# Patient Record
Sex: Male | Born: 1960 | Race: White | Hispanic: No | Marital: Single | Smoking: Current every day smoker
Health system: Southern US, Community
[De-identification: ages and names within clinical notes are randomized; demographics above are authoritative.]

---

## 2007-09-07 ENCOUNTER — Observation Stay (HOSPITAL_COMMUNITY): Admission: RE | Admit: 2007-09-07 | Discharge: 2007-09-08 | Payer: Self-pay | Admitting: Neurosurgery

## 2007-09-29 ENCOUNTER — Encounter: Admission: RE | Admit: 2007-09-29 | Discharge: 2007-09-29 | Payer: Self-pay | Admitting: Neurosurgery

## 2007-10-13 ENCOUNTER — Encounter: Admission: RE | Admit: 2007-10-13 | Discharge: 2007-10-13 | Payer: Self-pay | Admitting: Neurosurgery

## 2007-12-31 ENCOUNTER — Encounter: Admission: RE | Admit: 2007-12-31 | Discharge: 2007-12-31 | Payer: Self-pay | Admitting: Neurosurgery

## 2011-05-07 NOTE — Op Note (Signed)
NAMETIERRE, NETTO NO.:  [id number]   MEDICAL RECORD NO.:  [id number]          PATIENT TYPE:  INP   LOCATION:  5148                         FACILITY:  [facility]   PHYSICIAN:  Donalee Citrin, M.D.        DATE OF BIRTH:  May 18, 1961   DATE OF PROCEDURE:  DATE OF DISCHARGE:                               OPERATIVE REPORT   PREOPERATIVE DIAGNOSIS:  Cervical spondylosis with stenosis C4-5, C5-6  and C6-7.   PROCEDURE:  Anterior cervical diskectomy and fusion C4-5, C5-6 and C6-7  using 7 mm allograft wedges and a 60 mm Atlantis translational plate.   SURGEON:  Donalee Citrin, M.D.   ASSISTANT:  Kathaleen Maser. Pool, M.D.   ANESTHESIA:  General endotracheal.   HISTORY OF PRESENT ILLNESS:  The patient is a very pleasant 50 year old  gentleman with longstanding neck and bilateral arm pain, worse on the  left, in his left shoulder and down to his left hand, __________  distribution.  The patient has failed all forms of conservative  treatment and MRI scan showed kyphosis with spondylosis and cervical  stenosis, causing spinal cord compression and biforaminal stenosis at C4-  5, C5-6 and C6-7.  Patient failed all forms of conservative treatment  and was recommended to undergo cervical diskectomy and fusion.  The  risks and benefits of the operation were explained to the patient, who  understood and agreed to proceed forward.   PROCEDURE:  Patient was brought to the OR.  He was induced under general  endotracheal.  He was positioned supine, with the next flexed in  extension and 5 pounds of halter traction was applied.  The left side of  his neck was prepped and draped in the usual sterile fashion.  Preoperative x-ray localized the appropriate level at the C5-6 disk  space.  A curvilinear incision was made just off the midline to the  anterior portion of the sternocleidomastoid muscle .  The superficial  layer of the platysma was dissected out and divided longitudinally.  The  avascular  plane of the sternocleidomastoid muscle was developed down to  the prevertebral fascia.  The prevertebral fascia was then dissected  with the Kittner's.  Intraoperative x-ray confirmed  localization at the  C4-5 disk space and __________ disc space and then the longus colli was  reflected laterally at C4-5, C5-6 and C6-7.  Annulotomies were made at  all 3 disk spaces and self-retaining retractor was placed and then large  anterior osteophytes were underbitten off the C4, C5 and C6 vertebral  bodies bilaterally.  Upgoing curettes were used to scrape the disk  spaces and a high speed drill was used on all 3 interspaces down  posterior annulus and posterior __________  complexes.  Then the  operating microscope was draped, brought into the field and under  microscopic illumination,  first at C5-6 a __________  was drilled drown  to the posterior annulus and posterior longitudinal ligament.  Then  using a 1 mm Kerrison punch, a large osteophyte was underbitten  at the  C6 and C5 vertebral bodies, exposing the PLL  which was removed in  piecemeal fashion __________  thecal sac.  There is a marked spinal cord  compression at thecal sac compressing throughout the central canal as  well as biforaminally.  This was all underbitten  and the thecal sac was  decompressed.  Both proximal C6 pedicles were identified and C6  neuroforaminal were opened up.  Due to a significant uncinate  hypertrophy as well as spondylosis, the stenosis was freed up and the  disk space was decompressed at this level.  Gelfoam was placed.  Attention was directed at C6-7.  In a similar fashion,  C6-7 was  decompressed.  A very large uncinate hypertrophy with large osteophyte  was compressed in the right C7 nerve but this was all decompressed and  underbitten.  The C7 nerve root was skeletonized out the right side,  flush with the pedicle.  This procedure was repeated on the left side.  Again, once the end plates had been  underbitten aggressively,  decompression of the central canal on both C7 neuroforamen.  Gelfoam was  placed and attention was taken at C4-5.  In a similar fashion, a  large  osteophyte  at C4 vertebral body was underbitten with a 2 and 3 mm  Kerrison punch, decompressed in the central canal and both C5 pedicles  were identified.  Both C5 neuroforamen were identified.  Care was taken  not to aggressive __________ at the C5 nerve roots at each level but the  central canal was widely decompressed by aggressive underbiting of both  end plates.  Then the end plates  were scraped.  A size 7 medium  allograft was inserted at C4-5, C5-6, and C6-7 1mm deep to the  intervertebral line.  A 60 mm translation Atlantis plate was then  inserted with fixed angle screw placed at C4, C5, C6 and C7.  All screws  had excellent purchase.  Postop fluoroscopy confirmed visualization of  plates, screws, and bone grafts.  Then the wound was copiously irrigated  and meticulous hemostasis was obtained, Surgifoam was placed in the disk  spaces and irrigated out.  Then the JP drain was then placed and the  wound was closed in layers with Vicryl in the platysma, running 4-0  subcuticular.  Benzoin and Steri-Strips were applied. The patient was  taken to the recovery in stable condition.  At the end of the case,  needle and sponge counts were correct.           ______________________________  Donalee Citrin, M.D.     GC/MEDQ  D:  09/07/2007  T:  09/08/2007  Job:  [id number]

## 2011-10-04 LAB — DIFFERENTIAL
Basophils Absolute: 0
Basophils Relative: 0
Eosinophils Relative: 1
Lymphs Abs: 0.9
Monocytes Relative: 7

## 2011-10-04 LAB — CBC
HCT: 41.1
Hemoglobin: 14
MCHC: 34
RBC: 4.22
RDW: 12.6

## 2015-07-24 ENCOUNTER — Encounter (HOSPITAL_COMMUNITY): Payer: Self-pay | Admitting: Cardiology

## 2015-07-24 ENCOUNTER — Emergency Department (HOSPITAL_COMMUNITY): Payer: Medicare Other

## 2015-07-24 ENCOUNTER — Emergency Department (HOSPITAL_COMMUNITY)
Admission: EM | Admit: 2015-07-24 | Discharge: 2015-07-24 | Disposition: A | Discharge status: Home / Self Care | Payer: Medicare Other | Attending: Emergency Medicine | Admitting: Emergency Medicine

## 2015-07-24 DIAGNOSIS — G8929 Other chronic pain: Secondary | ICD-10-CM | POA: Insufficient documentation

## 2015-07-24 DIAGNOSIS — M542 Cervicalgia: Secondary | ICD-10-CM | POA: Diagnosis not present

## 2015-07-24 DIAGNOSIS — Z720 Tobacco use: Secondary | ICD-10-CM | POA: Diagnosis not present

## 2015-07-24 DIAGNOSIS — R569 Unspecified convulsions: Secondary | ICD-10-CM | POA: Diagnosis present

## 2015-07-24 LAB — COMPREHENSIVE METABOLIC PANEL
ALBUMIN: 3.9 g/dL (ref 3.5–5.0)
ALT: 41 U/L (ref 17–63)
ANION GAP: 14 (ref 5–15)
AST: 63 U/L — ABNORMAL HIGH (ref 15–41)
Alkaline Phosphatase: 45 U/L (ref 38–126)
BUN: 6 mg/dL (ref 6–20)
CO2: 21 mmol/L — ABNORMAL LOW (ref 22–32)
CREATININE: 0.95 mg/dL (ref 0.61–1.24)
Calcium: 8.8 mg/dL — ABNORMAL LOW (ref 8.9–10.3)
Chloride: 97 mmol/L — ABNORMAL LOW (ref 101–111)
Glucose, Bld: 118 mg/dL — ABNORMAL HIGH (ref 65–99)
POTASSIUM: 3.9 mmol/L (ref 3.5–5.1)
SODIUM: 132 mmol/L — AB (ref 135–145)
Total Bilirubin: 1.4 mg/dL — ABNORMAL HIGH (ref 0.3–1.2)
Total Protein: 6.7 g/dL (ref 6.5–8.1)

## 2015-07-24 LAB — CBC WITH DIFFERENTIAL/PLATELET
BASOS PCT: 0 % (ref 0–1)
Basophils Absolute: 0 10*3/uL (ref 0.0–0.1)
EOS ABS: 0 10*3/uL (ref 0.0–0.7)
Eosinophils Relative: 0 % (ref 0–5)
HEMATOCRIT: 34.5 % — AB (ref 39.0–52.0)
Hemoglobin: 12.2 g/dL — ABNORMAL LOW (ref 13.0–17.0)
LYMPHS ABS: 0.4 10*3/uL — AB (ref 0.7–4.0)
LYMPHS PCT: 8 % — AB (ref 12–46)
MCH: 33.9 pg (ref 26.0–34.0)
MCHC: 35.4 g/dL (ref 30.0–36.0)
MCV: 95.8 fL (ref 78.0–100.0)
MONO ABS: 0.4 10*3/uL (ref 0.1–1.0)
MONOS PCT: 8 % (ref 3–12)
NEUTROS ABS: 3.8 10*3/uL (ref 1.7–7.7)
NEUTROS PCT: 84 % — AB (ref 43–77)
Platelets: 100 10*3/uL — ABNORMAL LOW (ref 150–400)
RBC: 3.6 MIL/uL — ABNORMAL LOW (ref 4.22–5.81)
RDW: 14 % (ref 11.5–15.5)
WBC: 4.6 10*3/uL (ref 4.0–10.5)

## 2015-07-24 LAB — CBG MONITORING, ED: Glucose-Capillary: 125 mg/dL — ABNORMAL HIGH (ref 65–99)

## 2015-07-24 NOTE — ED Provider Notes (Signed)
CSN: [id number]     Arrival date & time 07/24/15  1201 History   First MD Initiated Contact with Patient 07/24/15 1202     Chief Complaint  Patient presents with  . Seizures     (Consider location/radiation/quality/duration/timing/severity/associated sxs/prior Treatment) Patient is a 54 y.o. male presenting with seizures. The history is provided by the patient and the EMS personnel. No language interpreter was used.  Seizures Seizure activity on arrival: no   Seizure type:  Unable to specify (EMS relates possible Grand-mal like activity) Preceding symptoms: no dizziness, no headache, no nausea and no numbness   Initial focality:  Unable to specify Episode characteristics: abnormal movements and generalized shaking   Return to baseline: yes   Severity:  Unable to specify Number of seizures this episode:  1 Progression:  Unable to specify Context: stress (pt states has been under increased stress lately)   Context: not alcohol withdrawal, not change in medication (pt was in pain clinic at time of seizure, denies any change in meds there), not drug use (pt denies), not fever, not intracranial lesion and not previous head injury   Recent head injury:  No recent head injuries PTA treatment:  None History of seizures: no     History reviewed. No pertinent past medical history. History reviewed. No pertinent past surgical history. History reviewed. No pertinent family history. History  Substance Use Topics  . Smoking status: Current Every Day Smoker  . Smokeless tobacco: Not on file  . Alcohol Use: Not on file    Review of Systems  Constitutional: Negative for fever and chills.  HENT: Negative for congestion and rhinorrhea.   Eyes: Negative for photophobia and visual disturbance.  Respiratory: Negative for shortness of breath and wheezing.   Cardiovascular: Negative for chest pain.  Gastrointestinal: Negative for nausea, vomiting and abdominal pain.  Genitourinary: Negative for  dysuria and difficulty urinating.  Musculoskeletal: Positive for neck pain (chronic, not recently worsened). Negative for gait problem.  Skin: Negative for color change and rash.  Neurological: Positive for seizures (as described in HPI). Negative for dizziness, light-headedness and headaches.  Psychiatric/Behavioral: Negative for confusion and agitation.      Allergies  Review of patient's allergies indicates no known allergies.  Home Medications   Prior to Admission medications   Not on File   BP 134/92 mmHg  Pulse 72  Temp(Src) 99.1 F (37.3 C) (Oral)  Resp 16  SpO2 98% Physical Exam  Constitutional: He is oriented to person, place, and time. No distress.  Appears disheveled  HENT:  Head: Normocephalic and atraumatic.  Eyes: Conjunctivae and EOM are normal. Pupils are equal, round, and reactive to light.  Neck: Normal range of motion. Neck supple.  Cardiovascular: Normal rate, regular rhythm and normal heart sounds.   No murmur heard. Pulmonary/Chest: Effort normal and breath sounds normal.  Abdominal: Soft. He exhibits no distension. There is no tenderness.  Musculoskeletal: Normal range of motion. He exhibits no edema.  Neurological: He is alert and oriented to person, place, and time. He has normal strength. No cranial nerve deficit (cranial nerves II through XII intact) or sensory deficit (Sensation intact throughout).  5 out of 5 strength in upper and lower extremities bilaterally  Skin: Skin is warm and dry. He is not diaphoretic.  Psychiatric: He has a normal mood and affect. His behavior is normal.  Nursing note and vitals reviewed.   ED Course  Procedures (including critical care time) Labs Review Labs Reviewed  CBC WITH DIFFERENTIAL/PLATELET -  Abnormal; Notable for the following:    RBC 3.60 (*)    Hemoglobin 12.2 (*)    HCT 34.5 (*)    Platelets 100 (*)    Neutrophils Relative % 84 (*)    Lymphocytes Relative 8 (*)    Lymphs Abs 0.4 (*)    All other  components within normal limits  COMPREHENSIVE METABOLIC PANEL - Abnormal; Notable for the following:    Sodium 132 (*)    Chloride 97 (*)    CO2 21 (*)    Glucose, Bld 118 (*)    Calcium 8.8 (*)    AST 63 (*)    Total Bilirubin 1.4 (*)    All other components within normal limits  CBG MONITORING, ED - Abnormal; Notable for the following:    Glucose-Capillary 125 (*)    All other components within normal limits    Imaging Review Ct Head Wo Contrast  07/24/2015   CLINICAL DATA:  Possible first time seizure. Found on floor at pain clinic.  EXAM: CT HEAD WITHOUT CONTRAST  TECHNIQUE: Contiguous axial images were obtained from the base of the skull through the vertex without intravenous contrast.  COMPARISON:  07/10/2011  FINDINGS: Skull and Sinuses:Remote fractures of the nasal arch. No acute osseous finding. Clear visualized sinuses and mastoids.  Orbits: No acute abnormality.  Brain: No acute or remote infarction, hemorrhage, hydrocephalus, or mass lesion/mass effect. No cortical findings to explain seizure.  IMPRESSION: Negative head CT.   Electronically Signed   By: Marnee Spring M.D.   On: 07/24/2015 12:55     EKG Interpretation None      MDM   Final diagnoses:  Observed seizure-like activity  Chronic neck pain    The patient is a 54 year old male with past medical history of chronic back pain currently being seen by pain management clinic he presents today with seizure-like activity occurred medially prior arrival at pain management clinic. On exam patient is awake and alert and does not appear to be postictal. He has no focal neurological deficits. Patient has no prior history of seizures. On exam patient is afebrile and vital signs are within normal limits. Given this being the patient's first time episode we'll obtain CBC, CMP, blood glucose, CT of the head without contrast.  On review of his labs patient is a leukocytosis and his hemoglobin is within normal limits.  Electrolytes are all within normal limits as well. Blood glucose is additionally within normal limits and appears to be an unlikely source of the patient's seizure-like activity. CT of the head was negative for any obvious intracranial abnormalities that would lower this patient's threshold for seizure.  Given patient's negative workup today, will refer him to be seen by neurology in the near future for further evaluation given this being his first possible seizure. Additionally I did discuss seizure precautions with the patient and provided him discharge instructions as well. Patient verbalizes understanding and agreement of the plan today. Patient was discharged home in good condition.    Madolyn Frieze, MD 07/24/15 1636  Elwin Mocha, MD 07/24/15 [phone]

## 2015-07-24 NOTE — ED Notes (Signed)
Patient transported to CT 

## 2015-07-24 NOTE — ED Notes (Signed)
CBG 125 °

## 2015-07-24 NOTE — ED Notes (Signed)
Pt to department via EMS- pt was at the pain clinic and staff reports that they found him on the floor in an exam room. Staff thinks that he had a seizure. Pt was confused with EMS but now oriented. Bp-118/60 Hr-112 Cbg-123 20g Left forearm.

## 2016-02-21 IMAGING — CT CT HEAD W/O CM
1 series · 16 of 30 positions shown, 20 images · non-contrast
Comparison: 07/10/2011

CLINICAL DATA: Possible first time seizure. Found on floor at pain
clinic.

EXAM:
CT HEAD WITHOUT CONTRAST
TECHNIQUE: Contiguous axial images were obtained from the base of the skull
through the vertex without intravenous contrast.

[Series 2: head 5.0 h30s · axial · 0.47mm/px · z∈[-170,-5]mm · 16 of 37 slices shown, 20 images]
[im 2/37  brain]
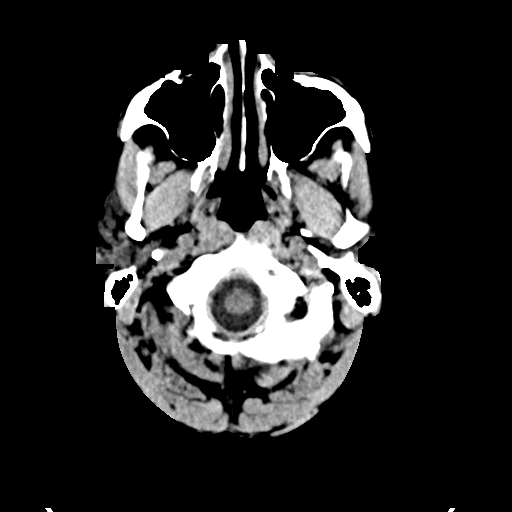
[im 2/37  bone]
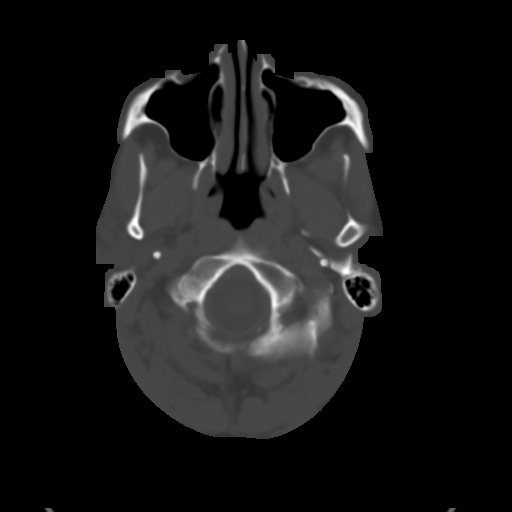
[im 4/37  brain]
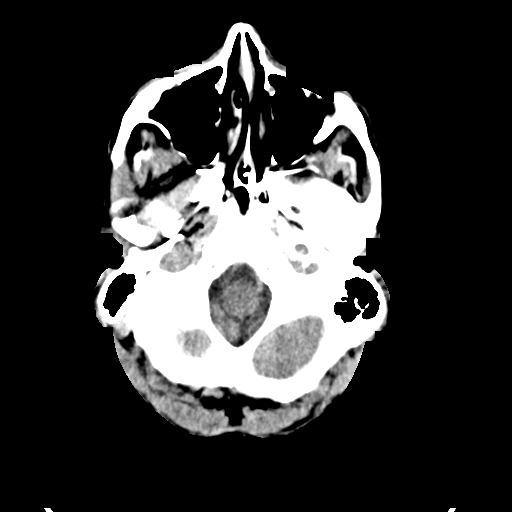
[im 7/37  brain]
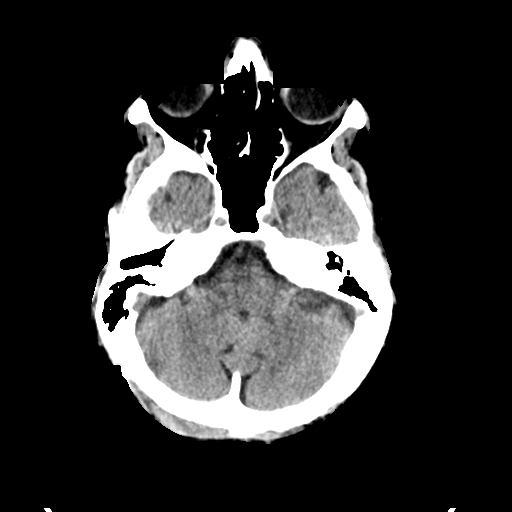
[im 9/37  brain]
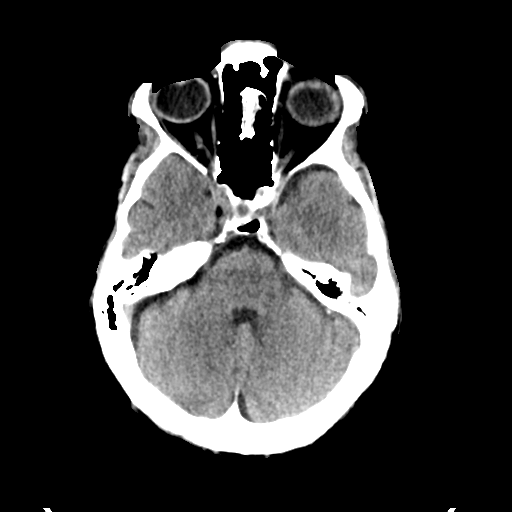
[im 10/37  brain]
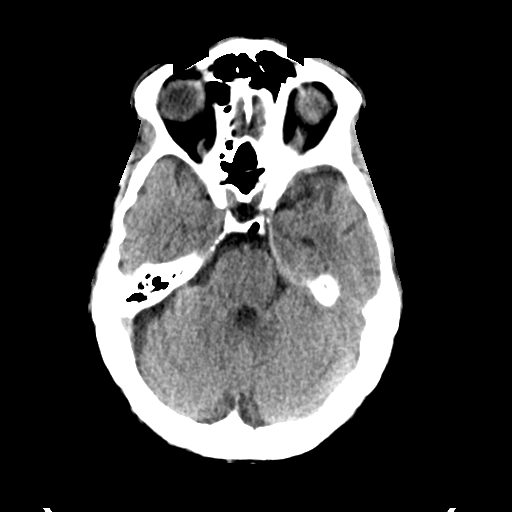
[im 10/37  bone]
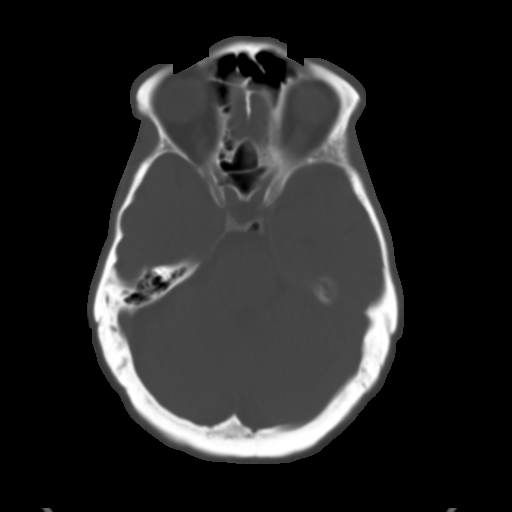
[im 13/37  brain]
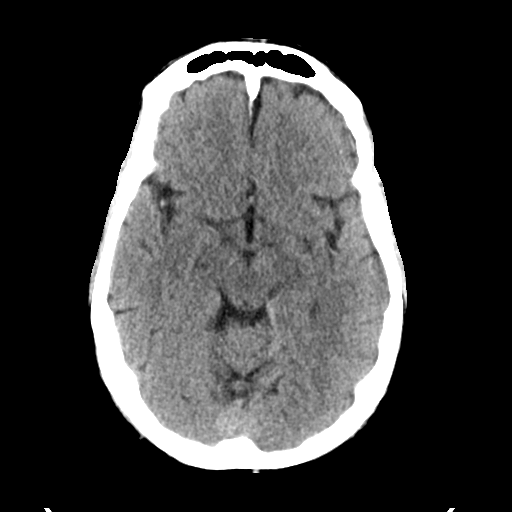
[im 15/37  brain]
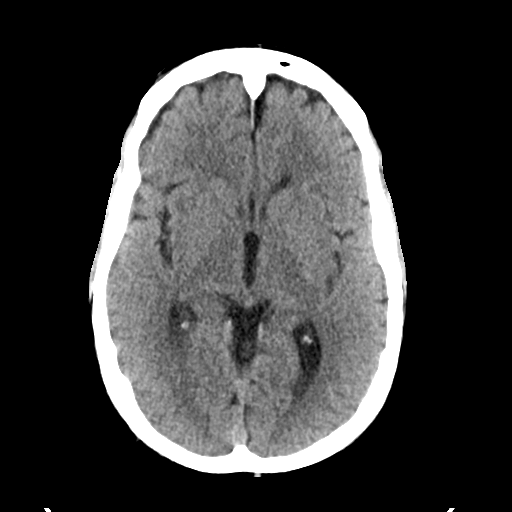
[im 18/37  brain]
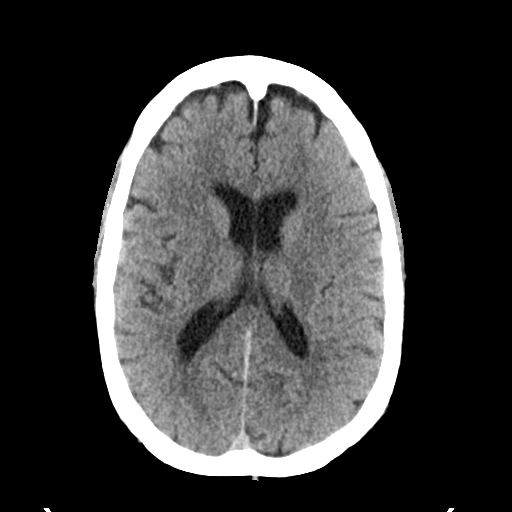
[im 19/37  brain]
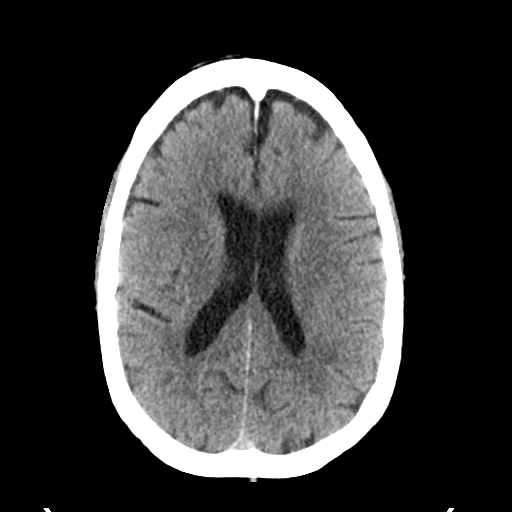
[im 19/37  bone]
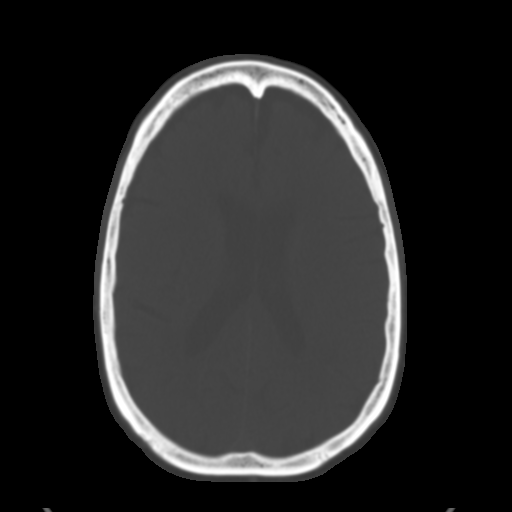
[im 22/37  brain]
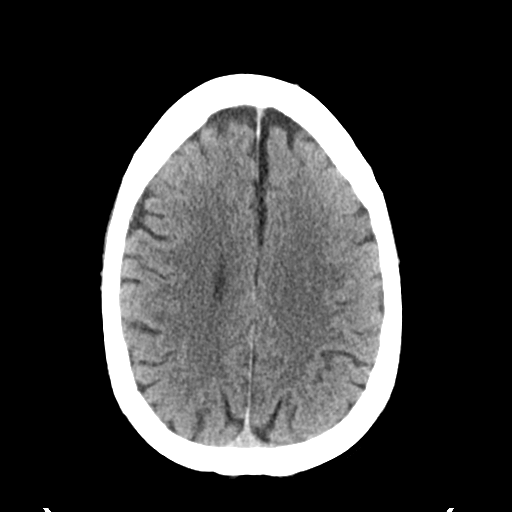
[im 24/37  brain]
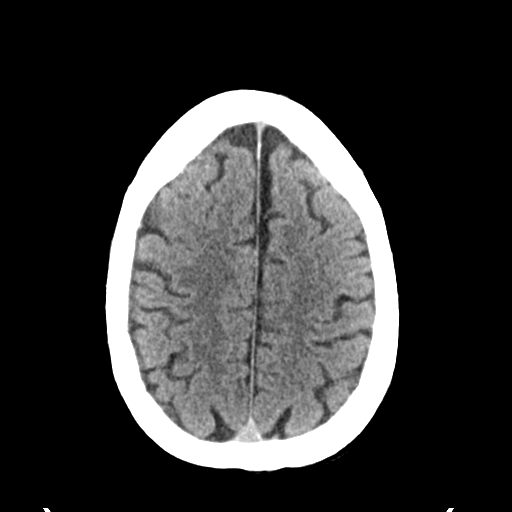
[im 27/37  brain]
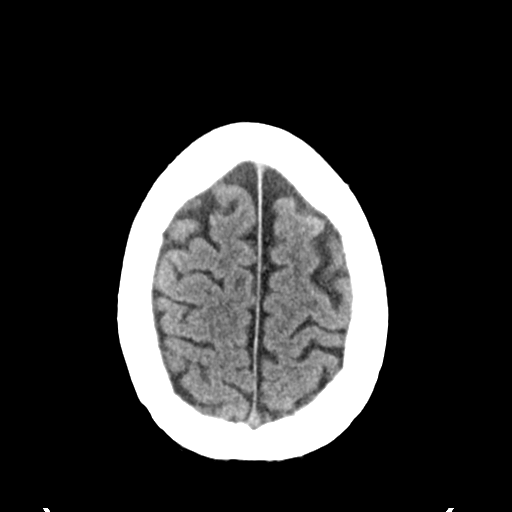
[im 28/37  brain]
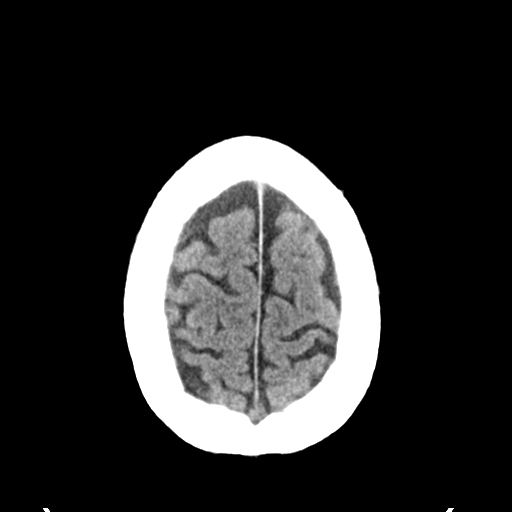
[im 28/37  bone]
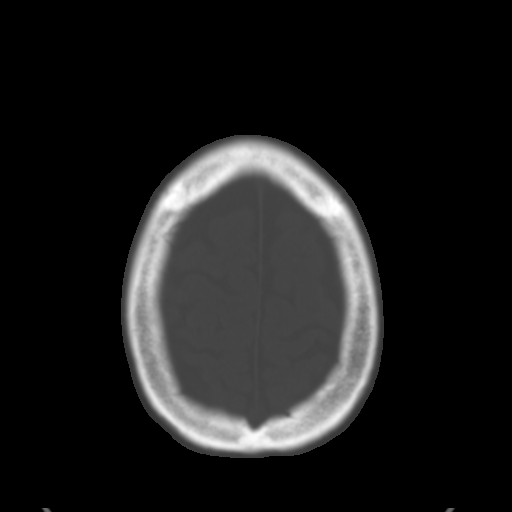
[im 30/37  brain]
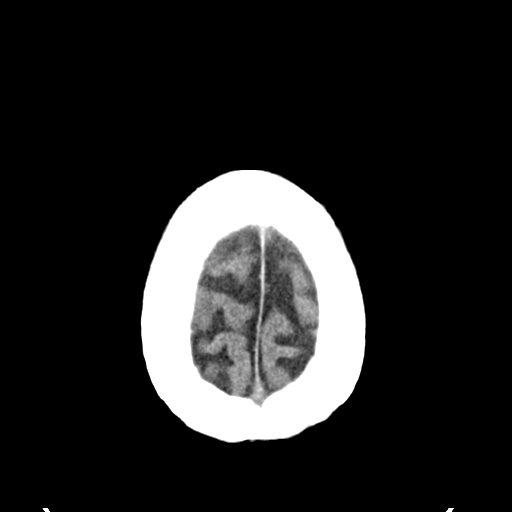
[im 33/37  brain]
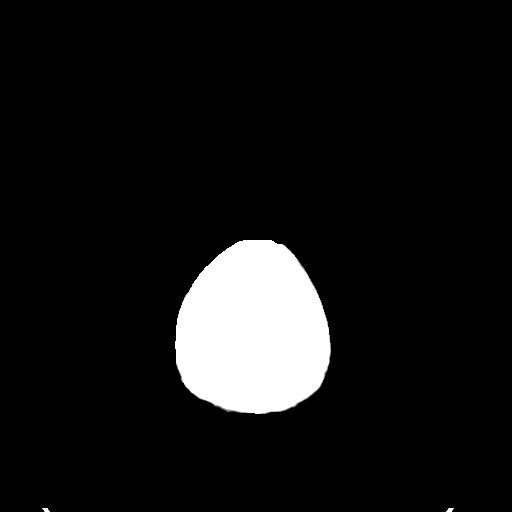
[im 35/37  brain]
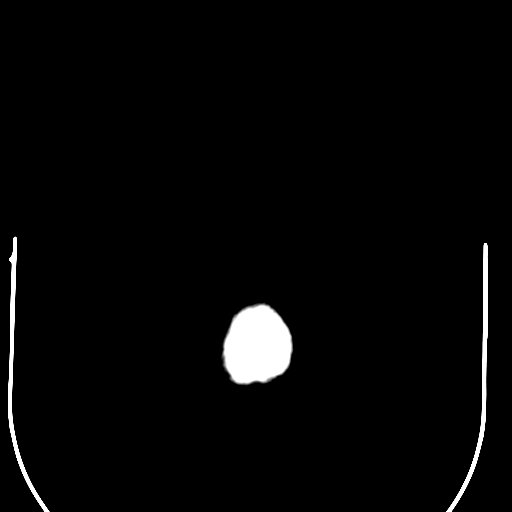

[16 of 30 positions shown; findings below may reference images not displayed]

FINDINGS: Skull and Sinuses:Remote fractures of the nasal arch. No acute
osseous finding. Clear visualized sinuses and mastoids.

Orbits: No acute abnormality.

Brain: No acute or remote infarction, hemorrhage, hydrocephalus, or
mass lesion/mass effect. No cortical findings to explain seizure.
IMPRESSION: Negative head CT.

## 2021-01-23 DEATH — deceased
